# Patient Record
Sex: Female | Born: 1955 | Race: White | Hispanic: No | Marital: Married | Smoking: Never smoker
Health system: Southern US, Community
[De-identification: ages and names within clinical notes are randomized; demographics above are authoritative.]

## PROBLEM LIST (undated history)

## (undated) DIAGNOSIS — M3500 Sicca syndrome, unspecified: Secondary | ICD-10-CM

## (undated) DIAGNOSIS — M129 Arthropathy, unspecified: Secondary | ICD-10-CM

## (undated) DIAGNOSIS — D329 Benign neoplasm of meninges, unspecified: Secondary | ICD-10-CM

## (undated) HISTORY — DX: Sjogren syndrome, unspecified: M35.00

## (undated) HISTORY — DX: Benign neoplasm of meninges, unspecified: D32.9

## (undated) HISTORY — DX: Arthropathy, unspecified: M12.9

---

## 2006-01-23 HISTORY — PX: ABDOMINAL HYSTERECTOMY: SHX81

## 2010-01-23 HISTORY — PX: CRANIOTOMY: SHX93

## 2015-07-16 ENCOUNTER — Other Ambulatory Visit: Payer: Self-pay | Admitting: Internal Medicine

## 2015-07-16 DIAGNOSIS — Z1231 Encounter for screening mammogram for malignant neoplasm of breast: Secondary | ICD-10-CM

## 2015-08-06 ENCOUNTER — Ambulatory Visit (INDEPENDENT_AMBULATORY_CARE_PROVIDER_SITE_OTHER): Payer: Federal, State, Local not specified - PPO | Admitting: Neurology

## 2015-08-06 ENCOUNTER — Encounter: Payer: Self-pay | Admitting: Neurology

## 2015-08-06 ENCOUNTER — Ambulatory Visit
Admission: RE | Admit: 2015-08-06 | Discharge: 2015-08-06 | Disposition: A | Discharge status: Home / Self Care | Payer: Federal, State, Local not specified - PPO | Source: Ambulatory Visit | Attending: Internal Medicine | Admitting: Internal Medicine

## 2015-08-06 VITALS — BP 124/86 | HR 74 | Ht 65.0 in | Wt 163.5 lb

## 2015-08-06 DIAGNOSIS — R202 Paresthesia of skin: Secondary | ICD-10-CM | POA: Diagnosis not present

## 2015-08-06 DIAGNOSIS — Z1231 Encounter for screening mammogram for malignant neoplasm of breast: Secondary | ICD-10-CM

## 2015-08-06 NOTE — Progress Notes (Signed)
Reason for visit: Paresthesias  Referring physician: Dr. Kathrin Greathouse Shelia Gates is a 60 y.o. 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 The patient has had some ongoing issues with double vision, left-sided proptosis, and headaches that initiate around the left eye that occur on average once a week. The patient reports mild memory issues following surgery as well. The patient comes in to this office today for evaluation of paresthesias on all 4 extremities. The patient reports numbness involving the right hand that dates back to around 2005. The symptoms have significantly worsened over the last several months, however. The patient has numbness that is much worse when she is working with the right arm above her head, but the symptoms also bother her at night and make it difficult for her to go to sleep. The patient has noted some mild symptoms as well on the left hand and she has begun to have some numbness and tingling in the toes and feet bilaterally, left greater than right. The patient has pins and needles feelings in the feet that again are worse in the evening hours. The patient denies any significant neck or low back pain or pain radiating down the arms or the legs. She denies any weakness, she denies any significant balance issues or difficulty controlling the bowels or the bladder. The patient has also had episodes of transient confusion, she has been on Keppra for these episodes cessation of these events. She is sent to this office for an evaluation.  Past Medical History  Diagnosis Date  . Sjogren's syndrome ([place])   . Arthropathy   . Meningioma [facility])     Past Surgical History  Procedure Laterality Date  . Craniotomy  2012  . Abdominal hysterectomy  2008    Family History  Problem Relation Age of Onset  . Heart  disease Father   . Breast cancer Mother   . Hypertension Mother   . Atrial fibrillation Mother   . Goiter Maternal Grandmother   . Diabetes Maternal Grandmother     Social history:  reports that she has never smoked. She does not have any smokeless tobacco history on file. She reports that she drinks about 0.6 - 1.2 oz of alcohol per week. She reports that she does not use illicit drugs.  Medications:  Prior to Admission medications   Not on File      Allergies  Allergen Reactions  . Clindamycin/Lincomycin Rash  . Hydrocodone Nausea And Vomiting  . Penicillins Rash  . Naprosyn [Naproxen] Other (See Comments)    Reflux/aspiration    ROS:  Out of a complete 14 system review of symptoms, the patient complains only of the following symptoms, and all other reviewed systems are negative.  Weight gain Double vision Joint pain, joint swelling Allergies Memory loss, headache, numbness Insomnia, snoring, restless legs Seizures, confusional events  Blood pressure 124/86, pulse 74, height 5\' 5"  (1.651 m), weight 163 lb 8 oz (74.163 kg).  Physical Exam  General: The patient is alert and cooperative at the time of the examination.  Eyes: Pupils are equal, round, and reactive to light. Discs are flat bilaterally. Proptosis is seen on the left eye.  Neck: The neck is supple, no carotid bruits are noted.  Respiratory: The respiratory examination is clear.  Cardiovascular: The cardiovascular examination reveals a regular rate and rhythm,  no obvious murmurs or rubs are noted.  Skin: Extremities are without significant edema.  Neurologic Exam  Mental status: The patient is alert and oriented x 3 at the time of the examination. The patient has apparent normal recent and remote memory, with an apparently normal attention span and concentration ability.  Cranial nerves: Facial symmetry is present. There is good sensation of the face to pinprick and soft touch bilaterally. The  strength of the facial muscles and the muscles to head turning and shoulder shrug are normal bilaterally. Speech is well enunciated, no aphasia or dysarthria is noted. Extraocular movements are full, but on primary gaze there is medial deviation of the left eye. Visual fields are full. The tongue is midline, and the patient has symmetric elevation of the soft palate. No obvious hearing deficits are noted.  Motor: The motor testing reveals 5 over 5 strength of all 4 extremities. Good symmetric motor tone is noted throughout.  Sensory: Sensory testing is intact to pinprick, soft touch, vibration sensation, and position sense on all 4 extremities, with exception that there is a stocking pattern pinprick sensory deficit one half way up the legs bilaterally. No evidence of extinction is noted.  Coordination: Cerebellar testing reveals good finger-nose-finger and heel-to-shin bilaterally.  Gait and station: Gait is normal. Tandem gait is normal. Romberg is negative. No drift is seen.  Reflexes: Deep tendon reflexes are symmetric and normal bilaterally. Toes are downgoing bilaterally.   Assessment/Plan:  1. Probable bilateral carpal tunnel syndrome  2. Lower extremity paresthesias, possible peripheral neuropathy  3. Confusional events, presumed seizures  4. Left cavernous sinus meningioma, status post resection  5. Intermittent headache  6. Reported memory disturbance  The patient has bilateral Tinel sign at the wrists, she very well could have bilateral carpal tunnel syndrome. Her symptoms have worsened in this regard recently. She is wearing a wrist splint on the right which has helped some. She is also reporting some paresthesias in the legs, we will set her up for nerve conduction studies on all 4 extremities, EMG on the right arm. If a peripheral neuropathy is present, further blood work will be done at that time. She will follow-up for the EMG evaluation.  Jill Alexanders MD 08/06/2015  3:45 PM  [facility] [address] [place], [place] [address]  Phone [phone] Fax [phone]

## 2015-09-08 ENCOUNTER — Ambulatory Visit (INDEPENDENT_AMBULATORY_CARE_PROVIDER_SITE_OTHER): Payer: Self-pay | Admitting: Neurology

## 2015-09-08 ENCOUNTER — Ambulatory Visit (INDEPENDENT_AMBULATORY_CARE_PROVIDER_SITE_OTHER): Payer: Federal, State, Local not specified - PPO | Admitting: Neurology

## 2015-09-08 ENCOUNTER — Other Ambulatory Visit: Payer: Self-pay | Admitting: Neurology

## 2015-09-08 ENCOUNTER — Encounter: Payer: Self-pay | Admitting: Neurology

## 2015-09-08 DIAGNOSIS — R202 Paresthesia of skin: Secondary | ICD-10-CM

## 2015-09-08 NOTE — Procedures (Signed)
     HISTORY:  Shelia Gates is a 60 year old patient with a history of paresthesias that involves all 4 extremities. The patient has significant discomfort involving the right arm and hand in particular. She denies any significant neck discomfort or pain radiating down the arms or legs. She is being evaluated for a possible peripheral neuropathy. The patient does have a history of Sjogren syndrome.  NERVE CONDUCTION STUDIES:  Nerve conduction studies were performed on both upper extremities. The distal motor latencies and motor amplitudes for the median and ulnar nerves were within normal limits. The F wave latencies and nerve conduction velocities for these nerves were also normal. The sensory latencies for the median and ulnar nerves were normal.  Nerve conduction studies were performed on both lower extremities. The distal motor latencies and motor amplitudes for the peroneal and posterior tibial nerves were within normal limits. The nerve conduction velocities for these nerves were also normal. The H reflex latencies were normal. The sensory latencies for the peroneal nerves were within normal limits.   EMG STUDIES:  EMG study was performed on the right upper extremity:  The first dorsal interosseous muscle reveals 2 to 4 K units with full recruitment. No fibrillations or positive waves were noted. The abductor pollicis brevis muscle reveals 2 to 4 K units with full recruitment. No fibrillations or positive waves were noted. The extensor indicis proprius muscle reveals 1 to 3 K units with full recruitment. No fibrillations or positive waves were noted. The pronator teres muscle reveals 2 to 3 K units with full recruitment. No fibrillations or positive waves were noted. The biceps muscle reveals 1 to 2 K units with full recruitment. No fibrillations or positive waves were noted. The triceps muscle reveals 2 to 4 K units with full recruitment. No fibrillations or positive waves were  noted. The anterior deltoid muscle reveals 2 to 3 K units with full recruitment. No fibrillations or positive waves were noted. The cervical paraspinal muscles were tested at 2 levels. No abnormalities of insertional activity were seen at either level tested. There was good relaxation.   IMPRESSION:  Nerve conduction studies done on all 4 extremities were within normal limits. No evidence of a peripheral neuropathy is seen. A small fiber neuropathy may be missed by a standard nerve conduction studies, however. Clinical correlation is required. EMG evaluation of the right upper extremity is unremarkable without evidence of an overlying cervical radiculopathy.  Jill Alexanders MD 09/08/2015 3:51 PM  [facility] [address] [place], [place] [address]  Phone [phone] Fax [phone]

## 2015-09-08 NOTE — Progress Notes (Signed)
The patient comes in today for EMG and nerve conduction study evaluation. These studies were completely normal, small fiber neuropathy could not be excluded.  Patient will be sent for blood work to exclude metabolic etiologies of her paresthesias on all 4 extremities. We may consider MRI of the cervical spine if the blood work is unremarkable.

## 2015-09-08 NOTE — Progress Notes (Signed)
Please refer to EMG and nerve conduction study procedure note. 

## 2015-09-10 ENCOUNTER — Telehealth: Payer: Self-pay

## 2015-09-10 LAB — MULTIPLE MYELOMA PANEL, SERUM
ALBUMIN/GLOB SERPL: 1.4 (ref 0.7–1.7)
ALPHA 1: 0.2 g/dL (ref 0.0–0.4)
ALPHA2 GLOB SERPL ELPH-MCNC: 0.7 g/dL (ref 0.4–1.0)
Albumin SerPl Elph-Mcnc: 4 g/dL (ref 2.9–4.4)
B-Globulin SerPl Elph-Mcnc: 1 g/dL (ref 0.7–1.3)
Gamma Glob SerPl Elph-Mcnc: 1 g/dL (ref 0.4–1.8)
Globulin, Total: 2.9 g/dL (ref 2.2–3.9)
IGM (IMMUNOGLOBULIN M), SRM: 65 mg/dL (ref 26–217)
IgA/Immunoglobulin A, Serum: 199 mg/dL (ref 87–352)
IgG (Immunoglobin G), Serum: 951 mg/dL (ref 700–1600)
TOTAL PROTEIN: 6.9 g/dL (ref 6.0–8.5)

## 2015-09-10 LAB — SEDIMENTATION RATE: Sed Rate: 2 mm/hr (ref 0–40)

## 2015-09-10 LAB — HIV ANTIBODY (ROUTINE TESTING W REFLEX): HIV Screen 4th Generation wRfx: NONREACTIVE

## 2015-09-10 LAB — VITAMIN B12: VITAMIN B 12: 369 pg/mL (ref 211–946)

## 2015-09-10 LAB — COPPER, SERUM: COPPER: 122 ug/dL (ref 72–166)

## 2015-09-10 LAB — RPR: RPR Ser Ql: NONREACTIVE

## 2015-09-10 LAB — B. BURGDORFI ANTIBODIES: Lyme IgG/IgM Ab: <0.91 {ISR} (ref 0.00–0.90)

## 2015-09-10 NOTE — Telephone Encounter (Signed)
-----   Message from Kathrynn Ducking, MD sent at 09/10/2015  7:23 AM EDT -----  The blood work results are unremarkable. Please call the patient.  ----- Message ----- From: Lavone Neri Lab Results In Sent: 09/09/2015   7:43 AM To: Kathrynn Ducking, MD

## 2015-09-10 NOTE — Telephone Encounter (Signed)
Called pt w/ unremarkable lab results. Verbalized understanding and appreciation for call. Results faxed to PCP (Dr. Achille Rich F# [phone]) per pt request.

## 2016-05-08 ENCOUNTER — Emergency Department
Admission: EM | Admit: 2016-05-08 | Discharge: 2016-05-08 | Disposition: A | Discharge status: Home / Self Care | Payer: Federal, State, Local not specified - PPO | Source: Home / Self Care | Attending: Family Medicine | Admitting: Family Medicine

## 2016-05-08 DIAGNOSIS — J069 Acute upper respiratory infection, unspecified: Secondary | ICD-10-CM

## 2016-05-08 DIAGNOSIS — B9789 Other viral agents as the cause of diseases classified elsewhere: Secondary | ICD-10-CM | POA: Diagnosis not present

## 2016-05-08 MED ORDER — BENZONATATE 200 MG PO CAPS: ORAL_CAPSULE | ORAL | 0 refills | Status: DC

## 2016-05-08 MED ORDER — DOXYCYCLINE HYCLATE 100 MG PO CAPS: 100.0000 mg | ORAL_CAPSULE | Freq: Two times a day (BID) | ORAL | 0 refills | Status: DC

## 2016-05-08 NOTE — ED Triage Notes (Signed)
Started Thursday, pt thought at first it was just allergies.  She became worse to the point that when she would lay down to sleep at night she heard "squeaking and crackles" in her lungs.  Has a yellow productive cough.  Denies fever

## 2016-05-08 NOTE — Discharge Instructions (Signed)
Take plain guaifenesin (1200mg extended release tabs such as Mucinex) twice daily, with plenty of water, for cough and congestion.  Get adequate rest.   °May use Afrin nasal spray (or generic oxymetazoline) each morning for about 5 days and then discontinue.  Also recommend using saline nasal spray several times daily and saline nasal irrigation (AYR is a common brand).  Use Flonase nasal spray each morning after using Afrin nasal spray and saline nasal irrigation. °Try warm salt water gargles for sore throat.  °Stop all antihistamines for now, and other non-prescription cough/cold preparations. °Begin Doxycycline if not improving about one week or if persistent fever develops   °

## 2016-05-08 NOTE — ED Provider Notes (Addendum)
[facility]    CSN: [id number] Arrival date & time: 05/08/16  1200     History   Chief Complaint Chief Complaint  Patient presents with  . Cough  . Chest Pain    HPI Shelia Gates is a 61 y.o. female.   Patient states that she has perennial allergies, and they began to flare up several weeks ago with increased clear nasal drainage.  She developed a partly productive cough 5 days ago that has become worse at night with ?wheezing during the night.  Yesterday she developed a scratchy throat.  She is now having pressure in her left ear, with difficulty equalizing pressure in her ears.  No fevers, chills, and sweats.    The history is provided by the patient.    Past Medical History:  Diagnosis Date  . Arthropathy   . Meningioma ([place])   . Sjogren's syndrome [facility])     Patient Active Problem List   Diagnosis Date Noted  . Paresthesia 08/06/2015    Past Surgical History:  Procedure Laterality Date  . ABDOMINAL HYSTERECTOMY  2008  . CRANIOTOMY  2012    OB History    No data available       Home Medications    Prior to Admission medications   Medication Sig Start Date End Date Taking? Authorizing Provider  amLODipine (NORVASC) 5 MG tablet Take 5 mg by mouth daily. 07/10/15   Historical Provider, MD  aspirin 81 MG chewable tablet Chew by mouth.    Historical Provider, MD  benzonatate (TESSALON) 200 MG capsule Take one cap by mouth at bedtime as needed for cough.  May repeat in 4 to 6 hours 05/08/16   Kandra Nicolas, MD  carvedilol (COREG) 12.5 MG tablet Take by mouth.    Historical Provider, MD  doxycycline (VIBRAMYCIN) 100 MG capsule Take 1 capsule (100 mg total) by mouth 2 (two) times daily. Take with food (Rx void after 05/16/16) 05/08/16   Kandra Nicolas, MD  estradiol (ESTRACE) 1 MG tablet Take by mouth.    Historical Provider, MD  fluticasone (FLONASE) 50 MCG/ACT nasal spray Place 2 sprays into both nostrils daily. Reported on 08/06/2015 05/01/15    Historical Provider, MD  hydroxychloroquine (PLAQUENIL) 200 MG tablet Take by mouth.    Historical Provider, MD  leflunomide (ARAVA) 10 MG tablet Take by mouth.    Historical Provider, MD  levETIRAcetam (KEPPRA) 500 MG tablet Take by mouth.    Historical Provider, MD  meloxicam (MOBIC) 15 MG tablet Take by mouth.    Historical Provider, MD    Family History Family History  Problem Relation Age of Onset  . Breast cancer Mother   . Hypertension Mother   . Atrial fibrillation Mother   . Heart disease Father   . Goiter Maternal Grandmother   . Diabetes Maternal Grandmother     Social History Social History  Substance Use Topics  . Smoking status: Never Smoker  . Smokeless tobacco: Not on file  . Alcohol use 0.6 - 1.2 oz/week    1 - 2 Standard drinks or equivalent per week     Allergies   Clindamycin/lincomycin; Hydrocodone; Penicillins; and Naprosyn [naproxen]   Review of Systems Review of Systems + sore throat + cough No pleuritic pain ? Wheezing at night + nasal congestion + post-nasal drainage No sinus pain/pressure No itchy/red eyes ? left earache No hemoptysis No SOB No fever/chills No nausea No vomiting No abdominal pain No diarrhea No urinary symptoms No  skin rash + fatigue + myalgias + headache Used OTC meds without relief   Physical Exam Triage Vital Signs ED Triage Vitals  Enc Vitals Group     BP 05/08/16 1334 (!) 165/101     Pulse Rate 05/08/16 1334 70     Resp --      Temp 05/08/16 1334 98.3 F (36.8 C)     Temp Source 05/08/16 1334 Oral     SpO2 05/08/16 1334 98 %     Weight 05/08/16 1335 168 lb (76.2 kg)     Height 05/08/16 1335 5\' 5"  (1.651 m)     Head Circumference --      Peak Flow --      Pain Score 05/08/16 1336 0     Pain Loc --      Pain Edu? --      Excl. in [place]? --    No data found.   Updated Vital Signs BP (!) 165/101 (BP Location: Left Arm)   Pulse 70   Temp 98.3 F (36.8 C) (Oral)   Ht 5\' 5"  (1.651 m)   Wt 168  lb (76.2 kg)   SpO2 98%   BMI 27.96 kg/m   Visual Acuity Right Eye Distance:   Left Eye Distance:   Bilateral Distance:    Right Eye Near:   Left Eye Near:    Bilateral Near:     Physical Exam Nursing notes and Vital Signs reviewed. Appearance:  Patient appears stated age, and in no acute distress Eyes:  Right pupil round, and reactive to light and accomodation.  Extraocular movement is intact.  Conjunctivae are not inflamed.  Patient wears a left eye patch.  Ears:  Canals normal.  Tympanic membranes normal.  Nose:  Mildly congested turbinates.  No sinus tenderness.    Pharynx:  Normal Neck:  Supple.  Tender enlarged posterior/lateral nodes are palpated bilaterally  Lungs:  Clear to auscultation.  Breath sounds are equal.  Moving air well. Heart:  Regular rate and rhythm without murmurs, rubs, or gallops.  Abdomen:  Nontender without masses or hepatosplenomegaly.  Bowel sounds are present.  No CVA or flank tenderness.  Extremities:  No edema.  Skin:  No rash present.    UC Treatments / Results  Labs (all labs ordered are listed, but only abnormal results are displayed) Labs Reviewed - No data to display  EKG  EKG Interpretation None       Radiology No results found.  Procedures Procedures (including critical care time)  Medications Ordered in UC Medications - No data to display   Initial Impression / Assessment and Plan / UC Course  I have reviewed the triage vital signs and the nursing notes.  Pertinent labs & imaging results that were available during my care of the patient were reviewed by me and considered in my medical decision making (see chart for details).    There is no evidence of bacterial infection today.   Treat symptomatically for now  Prescription written for Benzonatate (Tessalon) to take at bedtime for night-time cough.  Take plain guaifenesin (1200mg  extended release tabs such as Mucinex) twice daily, with plenty of water, for cough and  congestion.  Get adequate rest.   May use Afrin nasal spray (or generic oxymetazoline) each morning for about 5 days and then discontinue.  Also recommend using saline nasal spray several times daily and saline nasal irrigation (AYR is a common brand).  Use Flonase nasal spray each morning after using Afrin nasal  spray and saline nasal irrigation. Try warm salt water gargles for sore throat.  Stop all antihistamines for now, and other non-prescription cough/cold preparations. Begin Doxycycline if not improving about one week or if persistent fever develops (Given a prescription to hold, with an expiration date)     Final Clinical Impressions(s) / UC Diagnoses   Final diagnoses:  Viral URI with cough    New Prescriptions New Prescriptions   BENZONATATE (TESSALON) 200 MG CAPSULE    Take one cap by mouth at bedtime as needed for cough.  May repeat in 4 to 6 hours   DOXYCYCLINE (VIBRAMYCIN) 100 MG CAPSULE    Take 1 capsule (100 mg total) by mouth 2 (two) times daily. Take with food (Rx void after 05/16/16)     Kandra Nicolas, MD 05/11/16 1552    Kandra Nicolas, MD 05/11/16 [phone]

## 2016-05-11 ENCOUNTER — Emergency Department
Admission: EM | Admit: 2016-05-11 | Discharge: 2016-05-11 | Disposition: A | Discharge status: Home / Self Care | Payer: Federal, State, Local not specified - PPO | Source: Home / Self Care | Attending: Family Medicine | Admitting: Family Medicine

## 2016-05-11 ENCOUNTER — Emergency Department (INDEPENDENT_AMBULATORY_CARE_PROVIDER_SITE_OTHER): Payer: Federal, State, Local not specified - PPO

## 2016-05-11 ENCOUNTER — Encounter: Payer: Self-pay | Admitting: *Deleted

## 2016-05-11 DIAGNOSIS — R0602 Shortness of breath: Secondary | ICD-10-CM

## 2016-05-11 DIAGNOSIS — B9789 Other viral agents as the cause of diseases classified elsewhere: Secondary | ICD-10-CM | POA: Diagnosis not present

## 2016-05-11 DIAGNOSIS — J069 Acute upper respiratory infection, unspecified: Secondary | ICD-10-CM | POA: Diagnosis not present

## 2016-05-11 DIAGNOSIS — B349 Viral infection, unspecified: Secondary | ICD-10-CM

## 2016-05-11 DIAGNOSIS — J9801 Acute bronchospasm: Secondary | ICD-10-CM

## 2016-05-11 DIAGNOSIS — R05 Cough: Secondary | ICD-10-CM

## 2016-05-11 MED ORDER — IPRATROPIUM-ALBUTEROL 0.5-2.5 (3) MG/3ML IN SOLN
3.0000 mL | Freq: Once | RESPIRATORY_TRACT | Status: AC
  Administered 2016-05-11: 3 mL via RESPIRATORY_TRACT

## 2016-05-11 MED ORDER — METHYLPREDNISOLONE SODIUM SUCC 125 MG IJ SOLR
125.0000 mg | Freq: Once | INTRAMUSCULAR | Status: AC
  Administered 2016-05-11: 125 mg via INTRAMUSCULAR

## 2016-05-11 MED ORDER — PREDNISONE 20 MG PO TABS: ORAL_TABLET | ORAL | 0 refills | Status: DC

## 2016-05-11 MED ORDER — ALBUTEROL SULFATE HFA 108 (90 BASE) MCG/ACT IN AERS: 2.0000 | INHALATION_SPRAY | RESPIRATORY_TRACT | 0 refills | Status: AC | PRN

## 2016-05-11 NOTE — Discharge Instructions (Signed)
Begin prednisone Friday 05/12/16.   Continue Mucinex.  Continue Tessalon (benzonatate) for cough at bedtime. Use albuterol inhaler as needed.  Begin doxycycline if not improving 2 to 3 days or if fever develops.

## 2016-05-11 NOTE — ED Triage Notes (Signed)
Patient was seen for URI 05/08/16/ Since she has not improved and feels wheezing and SOB is worse. Afebrile.

## 2016-05-11 NOTE — ED Provider Notes (Signed)
[facility]    CSN: [id number] Arrival date & time: 05/11/16  1225     History   Chief Complaint Chief Complaint  Patient presents with  . Cough  . Shortness of Breath    HPI Shelia Gates is a 61 y.o. female.   Patient was evaluated for URI three days ago.  Since then she has developed persistent wheezing and shortness of breath, worse at night.  No pleuritic pain.  No fevers, chills, and sweats.  Her cough persists and is non-productive.   The history is provided by the patient.    Past Medical History:  Diagnosis Date  . Arthropathy   . Meningioma ([place])   . Sjogren's syndrome [facility])     Patient Active Problem List   Diagnosis Date Noted  . Paresthesia 08/06/2015    Past Surgical History:  Procedure Laterality Date  . ABDOMINAL HYSTERECTOMY  2008  . CRANIOTOMY  2012    OB History    No data available       Home Medications    Prior to Admission medications   Medication Sig Start Date End Date Taking? Authorizing Provider  albuterol (PROVENTIL HFA;VENTOLIN HFA) 108 (90 Base) MCG/ACT inhaler Inhale 2 puffs into the lungs every 4 (four) hours as needed for wheezing or shortness of breath. 05/11/16   Kandra Nicolas, MD  amLODipine (NORVASC) 5 MG tablet Take 5 mg by mouth daily. 07/10/15   Historical Provider, MD  aspirin 81 MG chewable tablet Chew by mouth.    Historical Provider, MD  benzonatate (TESSALON) 200 MG capsule Take one cap by mouth at bedtime as needed for cough.  May repeat in 4 to 6 hours 05/08/16   Kandra Nicolas, MD  carvedilol (COREG) 12.5 MG tablet Take by mouth.    Historical Provider, MD  doxycycline (VIBRAMYCIN) 100 MG capsule Take 1 capsule (100 mg total) by mouth 2 (two) times daily. Take with food (Rx void after 05/16/16) 05/08/16   Kandra Nicolas, MD  estradiol (ESTRACE) 1 MG tablet Take by mouth.    Historical Provider, MD  fluticasone (FLONASE) 50 MCG/ACT nasal spray Place 2 sprays into both nostrils daily. Reported on  08/06/2015 05/01/15   Historical Provider, MD  hydroxychloroquine (PLAQUENIL) 200 MG tablet Take by mouth.    Historical Provider, MD  leflunomide (ARAVA) 10 MG tablet Take by mouth.    Historical Provider, MD  levETIRAcetam (KEPPRA) 500 MG tablet Take by mouth.    Historical Provider, MD  meloxicam (MOBIC) 15 MG tablet Take by mouth.    Historical Provider, MD  predniSONE (DELTASONE) 20 MG tablet Take one tab by mouth twice daily for 5 days, then one daily for 3 days. Take with food. 05/11/16   Kandra Nicolas, MD    Family History Family History  Problem Relation Age of Onset  . Breast cancer Mother   . Hypertension Mother   . Atrial fibrillation Mother   . Heart disease Father   . Goiter Maternal Grandmother   . Diabetes Maternal Grandmother     Social History Social History  Substance Use Topics  . Smoking status: Never Smoker  . Smokeless tobacco: Never Used  . Alcohol use 0.6 - 1.2 oz/week    1 - 2 Standard drinks or equivalent per week     Allergies   Clindamycin/lincomycin; Hydrocodone; Penicillins; and Naprosyn [naproxen]   Review of Systems Review of Systems  + sore throat + cough No pleuritic pain + wheezing +  nasal congestion + post-nasal drainage No sinus pain/pressure No itchy/red eyes No earache No hemoptysis + SOB No fever/chills No nausea No vomiting No abdominal pain No diarrhea No urinary symptoms No skin rash + fatigue No myalgias No headache     Physical Exam Triage Vital Signs ED Triage Vitals  Enc Vitals Group     BP 05/11/16 1242 (!) 143/86     Pulse Rate 05/11/16 1242 94     Resp 05/11/16 1242 18     Temp 05/11/16 1242 98.7 F (37.1 C)     Temp Source 05/11/16 1242 Oral     SpO2 05/11/16 1337 97 %     Weight --      Height --      Head Circumference --      Peak Flow --      Pain Score 05/11/16 1243 3     Pain Loc --      Pain Edu? --      Excl. in [place]? --    No data found.   Updated Vital Signs BP (!) 143/86 (BP  Location: Left Arm)   Pulse 94   Temp 98.7 F (37.1 C) (Oral)   Resp 18   SpO2 97% Comment: POST DUONEB  Visual Acuity Right Eye Distance:   Left Eye Distance:   Bilateral Distance:    Right Eye Near:   Left Eye Near:    Bilateral Near:     Physical Exam Nursing notes and Vital Signs reviewed. Appearance:  Patient appears stated age, and in no acute distress Eyes:   Right pupil round, and reactive to light and accomodation.  Extraocular movement is intact.  Conjunctivae are not inflamed.  She wears a left eye patch. Ears:  Canals normal.  Tympanic membranes normal.  Nose:  Mildly congested turbinates.  No sinus tenderness.   Pharynx:  Normal Neck:  Supple.  Tender enlarged posterior/lateral nodes are palpated bilaterally  Lungs:  Diffuse bilateral expiratory wheezes.  Breath sounds are equal.  Moving air well.  Post DuoNeb treatment, wheezes almost completely cleared. Heart:  Regular rate and rhythm without murmurs, rubs, or gallops.  Abdomen:  Nontender without masses or hepatosplenomegaly.  Bowel sounds are present.  No CVA or flank tenderness.  Extremities:  No edema.  Skin:  No rash present.    UC Treatments / Results  Labs (all labs ordered are listed, but only abnormal results are displayed) Labs Reviewed - No data to display  EKG  EKG Interpretation None       Radiology Dg Chest 2 View  Result Date: 05/11/2016 CLINICAL DATA:  61 year old female with cough congestion and shortness of breath for 1 week. Initial encounter. EXAM: CHEST  2 VIEW COMPARISON:  None. FINDINGS: Mediastinal contours are within normal limits. Lung volumes are within normal limits. No pneumothorax, pulmonary edema, pleural effusion or confluent pulmonary opacity. No acute osseous abnormality identified. Negative visible bowel gas pattern. IMPRESSION: Negative.  No acute cardiopulmonary abnormality. Electronically Signed   By: Genevie Cornie M.D.   On: 05/11/2016 13:01    Procedures Procedures  (including critical care time)  Medications Ordered in UC Medications  methylPREDNISolone sodium succinate (SOLU-MEDROL) 125 mg/2 mL injection 125 mg (not administered)  ipratropium-albuterol (DUONEB) 0.5-2.5 (3) MG/3ML nebulizer solution 3 mL (3 mLs Nebulization Given 05/11/16 1331)     Initial Impression / Assessment and Plan / UC Course  I have reviewed the triage vital signs and the nursing notes.  Pertinent labs & imaging  results that were available during my care of the patient were reviewed by me and considered in my medical decision making (see chart for details).    Normal chest X-ray reassuring. Administered DuoNeb by hand held nebulizer (note improved SpO2 97% afterwards) Administered Solumedrol 125mg  IM  Begin prednisone burst/taper Friday 05/12/16.   Continue Mucinex.  Continue Tessalon (benzonatate) for cough at bedtime. Use albuterol inhaler as needed.  Begin doxycycline if not improving 2 to 3 days or if fever develops.    Final Clinical Impressions(s) / UC Diagnoses   Final diagnoses:  Viral URI with cough  Acute bronchospasm due to viral infection    New Prescriptions New Prescriptions   ALBUTEROL (PROVENTIL HFA;VENTOLIN HFA) 108 (90 BASE) MCG/ACT INHALER    Inhale 2 puffs into the lungs every 4 (four) hours as needed for wheezing or shortness of breath.   PREDNISONE (DELTASONE) 20 MG TABLET    Take one tab by mouth twice daily for 5 days, then one daily for 3 days. Take with food.     Kandra Nicolas, MD 05/11/16 [phone]

## 2016-09-04 ENCOUNTER — Ambulatory Visit (INDEPENDENT_AMBULATORY_CARE_PROVIDER_SITE_OTHER): Payer: Federal, State, Local not specified - PPO | Admitting: Obstetrics & Gynecology

## 2016-09-04 ENCOUNTER — Encounter: Payer: Self-pay | Admitting: Obstetrics & Gynecology

## 2016-09-04 VITALS — BP 138/99 | HR 67 | Ht 65.0 in | Wt 167.2 lb

## 2016-09-04 DIAGNOSIS — Z7689 Persons encountering health services in other specified circumstances: Secondary | ICD-10-CM

## 2016-09-04 NOTE — Progress Notes (Signed)
Subjective:   S. 62 yo MW P1 (7 yo daughter) here to establish care. She recently moved from [place]. She had an annual exam in [place] in June of this year. She no longer needs paps as she had a hysterectomy (with removal of ovaries and tubes)  in 2008 for prolapse. She is on estratest and will need a refill in the future, gets q 3 month prescriptions (1.25/2.5).

## 2016-09-04 NOTE — Progress Notes (Signed)
Patient here for GYN visit today.

## 2016-09-14 ENCOUNTER — Other Ambulatory Visit: Payer: Self-pay | Admitting: Obstetrics & Gynecology

## 2016-10-10 ENCOUNTER — Other Ambulatory Visit (HOSPITAL_COMMUNITY): Payer: Self-pay | Admitting: Obstetrics & Gynecology

## 2016-10-10 DIAGNOSIS — Z1231 Encounter for screening mammogram for malignant neoplasm of breast: Secondary | ICD-10-CM

## 2016-10-13 ENCOUNTER — Ambulatory Visit: Payer: Federal, State, Local not specified - PPO

## 2016-10-21 ENCOUNTER — Other Ambulatory Visit: Payer: Self-pay | Admitting: Obstetrics & Gynecology

## 2016-10-23 ENCOUNTER — Telehealth: Payer: Self-pay | Admitting: *Deleted

## 2016-10-23 DIAGNOSIS — N951 Menopausal and female climacteric states: Secondary | ICD-10-CM

## 2016-10-23 MED ORDER — ESTRADIOL 1 MG PO TABS: 1.0000 mg | ORAL_TABLET | Freq: Every day | ORAL | 3 refills | Status: DC

## 2016-10-23 NOTE — Telephone Encounter (Signed)
Pt request RF on Estrace vaginal tab for 90 supply.  Per Dr Hulan Fray RF [facility].

## 2016-11-20 ENCOUNTER — Other Ambulatory Visit: Payer: Self-pay | Admitting: Obstetrics & Gynecology

## 2016-12-19 ENCOUNTER — Ambulatory Visit: Payer: Federal, State, Local not specified - PPO

## 2016-12-21 ENCOUNTER — Ambulatory Visit
Admission: RE | Admit: 2016-12-21 | Discharge: 2016-12-21 | Disposition: A | Discharge status: Home / Self Care | Payer: Federal, State, Local not specified - PPO | Source: Ambulatory Visit | Attending: Obstetrics & Gynecology | Admitting: Obstetrics & Gynecology

## 2016-12-21 DIAGNOSIS — Z1231 Encounter for screening mammogram for malignant neoplasm of breast: Secondary | ICD-10-CM

## 2016-12-25 ENCOUNTER — Other Ambulatory Visit (HOSPITAL_COMMUNITY): Payer: Self-pay | Admitting: Obstetrics & Gynecology

## 2016-12-25 DIAGNOSIS — R928 Other abnormal and inconclusive findings on diagnostic imaging of breast: Secondary | ICD-10-CM

## 2016-12-28 ENCOUNTER — Ambulatory Visit
Admission: RE | Admit: 2016-12-28 | Discharge: 2016-12-28 | Disposition: A | Discharge status: Home / Self Care | Payer: Federal, State, Local not specified - PPO | Source: Ambulatory Visit | Attending: Obstetrics & Gynecology | Admitting: Obstetrics & Gynecology

## 2016-12-28 DIAGNOSIS — R928 Other abnormal and inconclusive findings on diagnostic imaging of breast: Secondary | ICD-10-CM

## 2017-05-11 ENCOUNTER — Other Ambulatory Visit: Payer: Self-pay | Admitting: Obstetrics & Gynecology

## 2017-07-12 ENCOUNTER — Other Ambulatory Visit: Payer: Self-pay

## 2017-07-12 ENCOUNTER — Encounter: Payer: Self-pay | Admitting: Emergency Medicine

## 2017-07-12 ENCOUNTER — Emergency Department
Admission: EM | Admit: 2017-07-12 | Discharge: 2017-07-12 | Disposition: A | Discharge status: Home / Self Care | Payer: Federal, State, Local not specified - PPO | Source: Home / Self Care | Attending: Family Medicine | Admitting: Family Medicine

## 2017-07-12 DIAGNOSIS — L03011 Cellulitis of right finger: Secondary | ICD-10-CM

## 2017-07-12 MED ORDER — MUPIROCIN 2 % EX OINT: TOPICAL_OINTMENT | CUTANEOUS | 0 refills | Status: AC

## 2017-07-12 NOTE — ED Triage Notes (Signed)
Right index finger red at base of nail bed x 2 weeks. She was in [place] when she noticed it, did not have it checked, denies injury.

## 2017-07-12 NOTE — Discharge Instructions (Signed)
°  It is recommended you soak your finger in warm water with Epson salt for 10-15 minutes 2-3 times daily. Pat dry the area, then apply the prescribed antibiotic ointment. You may want to wear a bandage to help the medication stay on your finger longer.  Please follow up with family medicine or return to urgent care in 4-5 days if not improving, sooner if worsening.

## 2017-07-12 NOTE — ED Provider Notes (Signed)
[facility]    CSN: [id number] Arrival date & time: 07/12/17  0813     History   Chief Complaint Chief Complaint  Patient presents with  . Hand Pain    HPI Conswella Bruney is a 62 y.o. female.   HPI  Karesa Maultsby is a 62 y.o. female presenting to UC with c/o Right index finger pain that has been intermittent with associated redness of skin around her nailbed for about 2 weeks.  She was in [place] when symptoms started.  She showed it to her rheumatologist last week, who wasn't too concerned at the time but advised her to monitor it.  This morning the pain was worse.  She does not believe it needs to be lanced yet but wanted it to be looked at just in case.    Past Medical History:  Diagnosis Date  . Arthropathy   . Meningioma ([place])   . Sjogren's syndrome [facility])     Patient Active Problem List   Diagnosis Date Noted  . Paresthesia 08/06/2015    Past Surgical History:  Procedure Laterality Date  . ABDOMINAL HYSTERECTOMY  2008  . CRANIOTOMY  2012    OB History    Gravida  1   Para      Term      Preterm      AB      Living  1     SAB      TAB      Ectopic      Multiple      Live Births               Home Medications    Prior to Admission medications   Medication Sig Start Date End Date Taking? Authorizing Provider  candesartan-hydrochlorothiazide (ATACAND HCT) 16-12.5 MG tablet Take 1 tablet by mouth daily.   Yes [provider]  meloxicam (MOBIC) 15 MG tablet Take 15 mg by mouth daily.   Yes [provider]  albuterol (PROVENTIL HFA;VENTOLIN HFA) 108 (90 Base) MCG/ACT inhaler Inhale 2 puffs into the lungs every 4 (four) hours as needed for wheezing or shortness of breath. 05/11/16   Kandra Nicolas, MD  amLODipine (NORVASC) 5 MG tablet Take 5 mg by mouth daily. 07/10/15   [provider]  aspirin 81 MG chewable tablet Chew by mouth.    [provider]  carvedilol (COREG) 12.5 MG tablet Take by mouth.     [provider]  estradiol (ESTRACE) 1 MG tablet Take 1 tablet (1 mg total) by mouth daily. 10/23/16   Dove, Wilhemina Cash, MD  estrogens-methylTEST (ESTRATEST) 1.25-2.5 MG tablet TAKE 1 TABLET EVERY DAY 10/31/16   Dove, Myra C, MD  fluticasone (FLONASE) 50 MCG/ACT nasal spray Place 2 sprays into both nostrils daily. Reported on 08/06/2015 05/01/15   [provider]  hydroxychloroquine (PLAQUENIL) 200 MG tablet Take by mouth.    [provider]  levETIRAcetam (KEPPRA) 500 MG tablet Take by mouth.    [provider]  mupirocin ointment (BACTROBAN) 2 % Apply to finger 3 times daily for 5 days 07/12/17   Noe Gens, PA-C    Family History Family History  Problem Relation Age of Onset  . Breast cancer Mother 64  . Hypertension Mother   . Atrial fibrillation Mother   . Heart disease Father   . Goiter Maternal Grandmother   . Diabetes Maternal Grandmother     Social History Social History   Tobacco Use  .  Smoking status: Never Smoker  . Smokeless tobacco: Never Used  Substance Use Topics  . Alcohol use: Yes    Alcohol/week: 0.6 - 1.2 oz    Types: 1 - 2 Standard drinks or equivalent per week  . Drug use: No     Allergies   Clindamycin/lincomycin; Hydrocodone; Penicillins; and Naprosyn [naproxen]   Review of Systems Review of Systems  Musculoskeletal: Negative for arthralgias, joint swelling and myalgias.  Skin: Positive for color change. Negative for wound.     Physical Exam Triage Vital Signs ED Triage Vitals  Enc Vitals Group     BP 07/12/17 0827 125/89     Pulse Rate 07/12/17 0827 61     Resp --      Temp 07/12/17 0827 98.4 F (36.9 C)     Temp Source 07/12/17 0827 Oral     SpO2 07/12/17 0827 99 %     Weight 07/12/17 0828 178 lb (80.7 kg)     Height 07/12/17 0828 5\' 5"  (1.651 m)     Head Circumference --      Peak Flow --      Pain Score 07/12/17 0828 3     Pain Loc --      Pain Edu? --      Excl. in [place]? --    No data  found.  Updated Vital Signs BP 125/89 (BP Location: Right Arm)   Pulse 61   Temp 98.4 F (36.9 C) (Oral)   Ht 5\' 5"  (1.651 m)   Wt 178 lb (80.7 kg)   SpO2 99%   BMI 29.62 kg/m   Visual Acuity Right Eye Distance:   Left Eye Distance:   Bilateral Distance:    Right Eye Near:   Left Eye Near:    Bilateral Near:     Physical Exam  Constitutional: She is oriented to person, place, and time. She appears well-developed and well-nourished. No distress.  HENT:  Head: Normocephalic and atraumatic.  Eyes: EOM are normal.  Neck: Normal range of motion.  Cardiovascular: Normal rate.  Pulmonary/Chest: Effort normal.  Musculoskeletal: Normal range of motion. She exhibits tenderness.  Right index finger: full ROM, mild tenderness to distal aspect.   Neurological: She is alert and oriented to person, place, and time.  Skin: Skin is warm and dry. Capillary refill takes less than 2 seconds. She is not diaphoretic. There is erythema.  Right index finger: minimal edema with erythema along radial aspect of nailbed, mildly tender. No fluctuance, bleeding or drainage.   Psychiatric: She has a normal mood and affect. Her behavior is normal.  Nursing note and vitals reviewed.    UC Treatments / Results  Labs (all labs ordered are listed, but only abnormal results are displayed) Labs Reviewed - No data to display  EKG None  Radiology No results found.  Procedures Procedures (including critical care time)  Medications Ordered in UC Medications - No data to display  Initial Impression / Assessment and Plan / UC Course  I have reviewed the triage vital signs and the nursing notes.  Pertinent labs & imaging results that were available during my care of the patient were reviewed by me and considered in my medical decision making (see chart for details).    Exam c/w early paronychia. No indication for I&D at this time.  Home care instructions with info packet provided.   Final  Clinical Impressions(s) / UC Diagnoses   Final diagnoses:  Paronychia of right index finger  Discharge Instructions      It is recommended you soak your finger in warm water with Epson salt for 10-15 minutes 2-3 times daily. Pat dry the area, then apply the prescribed antibiotic ointment. You may want to wear a bandage to help the medication stay on your finger longer.  Please follow up with family medicine or return to urgent care in 4-5 days if not improving, sooner if worsening.     ED Prescriptions    Medication Sig Dispense Auth. Provider   mupirocin ointment (BACTROBAN) 2 % Apply to finger 3 times daily for 5 days 22 g Noe Gens, PA-C     Controlled Substance Prescriptions [place] Controlled Substance Registry consulted? Not Applicable   Tyrell Antonio 07/12/17 7915

## 2017-08-16 ENCOUNTER — Encounter: Payer: Self-pay | Admitting: Obstetrics & Gynecology

## 2017-08-16 ENCOUNTER — Ambulatory Visit: Payer: Federal, State, Local not specified - PPO | Admitting: Obstetrics & Gynecology

## 2017-08-16 VITALS — BP 124/83 | HR 70 | Resp 16 | Ht 65.0 in | Wt 179.0 lb

## 2017-08-16 DIAGNOSIS — R102 Pelvic and perineal pain: Secondary | ICD-10-CM

## 2017-08-16 MED ORDER — VALACYCLOVIR HCL 1 G PO TABS: ORAL_TABLET | ORAL | 4 refills | Status: AC

## 2017-08-16 NOTE — Progress Notes (Signed)
   Subjective:    Patient ID: Shelia Gates, female    DOB: 1955-10-15, 62 y.o.   MRN: [id number]  HPI  62 yo married P1 (19 yo daughter) here today with the issue of vaginal pain and burning, a repeat of what happened when she was in [place] 5/19. She had a one particular spot on the inner right labia minora that itched. She also has burning of both labia when she wipes.  She uses a combination of coconut oil and shea butter for lubricant with sex.  Review of Systems     Objective:   Physical Exam  Breathing, conversing, and ambulating normally Well nourished, well hydrated White female, no apparent distress Normal appearing vulva, minimal atrophy Normal appearing vaginal discharge with speculum exam     Assessment & Plan:  Vulvar burning, itching - send a wet prep Trial of valtrex Come back prn

## 2017-08-17 LAB — CERVICOVAGINAL ANCILLARY ONLY
Bacterial vaginitis: NEGATIVE
CANDIDA VAGINITIS: POSITIVE — AB

## 2017-08-20 ENCOUNTER — Telehealth: Payer: Self-pay | Admitting: *Deleted

## 2017-08-20 MED ORDER — FLUCONAZOLE 150 MG PO TABS: ORAL_TABLET | ORAL | 0 refills | Status: AC

## 2017-08-20 NOTE — Telephone Encounter (Signed)
LM on voicemail that she does have yeast and [facility] VO Dr Hulan Fray.

## 2017-08-20 NOTE — Telephone Encounter (Signed)
-----   Message from Emily Filbert, MD sent at 08/20/2017  9:06 AM EDT ----- Can you please prescribe diflucan? Thanks

## 2017-10-08 ENCOUNTER — Telehealth: Payer: Self-pay | Admitting: *Deleted

## 2017-10-08 NOTE — Telephone Encounter (Signed)
Pt called stating that she was taking Estradiol 1 mg and her breast are very tender.  She is wanting to take .5 mg to see if it helps with the tenderness.  Per Dr Hulan Fray it is fine to half the med

## 2017-12-07 ENCOUNTER — Encounter: Payer: Self-pay | Admitting: Emergency Medicine

## 2017-12-07 ENCOUNTER — Emergency Department
Admission: EM | Admit: 2017-12-07 | Discharge: 2017-12-07 | Disposition: A | Discharge status: Home / Self Care | Payer: Federal, State, Local not specified - PPO | Source: Home / Self Care | Attending: Family Medicine | Admitting: Family Medicine

## 2017-12-07 ENCOUNTER — Other Ambulatory Visit: Payer: Self-pay

## 2017-12-07 DIAGNOSIS — R053 Chronic cough: Secondary | ICD-10-CM

## 2017-12-07 DIAGNOSIS — R05 Cough: Secondary | ICD-10-CM

## 2017-12-07 DIAGNOSIS — J069 Acute upper respiratory infection, unspecified: Secondary | ICD-10-CM | POA: Diagnosis not present

## 2017-12-07 MED ORDER — AZITHROMYCIN 250 MG PO TABS: 250.0000 mg | ORAL_TABLET | Freq: Every day | ORAL | 0 refills | Status: AC

## 2017-12-07 MED ORDER — BENZONATATE 100 MG PO CAPS: 100.0000 mg | ORAL_CAPSULE | Freq: Three times a day (TID) | ORAL | 0 refills | Status: AC

## 2017-12-07 NOTE — Discharge Instructions (Signed)
°  Please take antibiotics as prescribed and be sure to complete entire course even if you start to feel better to ensure infection does not come back.  Please follow up with family medicine in 1 week if not improving.

## 2017-12-07 NOTE — ED Provider Notes (Signed)
[facility]    CSN: [id number] Arrival date & time: 12/07/17  1448     History   Chief Complaint Chief Complaint  Patient presents with  . Cough    HPI Shelia Gates is a 62 y.o. female.   HPI  Shelia Gates is a 62 y.o. female presenting to UC with c/o cough that started on 11/14/17 for about 10 days, it cleared for about 2-3 days but has since returned and is more productive and forceful.  Last night it caused her to cough for 3 minutes, scaring pt it was so harsh.  She has taken mucinex and overall feels better but is concerned with the severity of cough at night.  Denies fever, chills, n/v/d. No specific known sick contacts but pt is a [employer] scientist.    Past Medical History:  Diagnosis Date  . Arthropathy   . Meningioma ([place])   . Sjogren's syndrome [facility] [place])     Patient Active Problem List   Diagnosis Date Noted  . Paresthesia 08/06/2015    Past Surgical History:  Procedure Laterality Date  . ABDOMINAL HYSTERECTOMY  2008  . CRANIOTOMY  2012    OB History    Gravida  1   Para  1   Term      Preterm      AB      Living  1     SAB      TAB      Ectopic      Multiple      Live Births               Home Medications    Prior to Admission medications   Medication Sig Start Date End Date Taking? Authorizing Provider  albuterol (PROVENTIL HFA;VENTOLIN HFA) 108 (90 Base) MCG/ACT inhaler Inhale 2 puffs into the lungs every 4 (four) hours as needed for wheezing or shortness of breath. 05/11/16   Kandra Nicolas, MD  amLODipine (NORVASC) 5 MG tablet Take 5 mg by mouth daily. 07/10/15   [provider]  aspirin 81 MG chewable tablet Chew by mouth.    [provider]  azithromycin (ZITHROMAX) 250 MG tablet Take 1 tablet (250 mg total) by mouth daily. Take first 2 tablets together, then 1 every day until finished. 12/07/17   Noe Gens, PA-C  benzonatate (TESSALON) 100 MG capsule Take 1-2 capsules (100-200 mg total) by mouth every 8  (eight) hours. 12/07/17   Noe Gens, PA-C  candesartan-hydrochlorothiazide (ATACAND HCT) 16-12.5 MG tablet Take 1 tablet by mouth daily.    [provider]  carvedilol (COREG) 12.5 MG tablet Take by mouth.    [provider]  estradiol (ESTRACE) 1 MG tablet Take 1 tablet (1 mg total) by mouth daily. Patient taking differently: Take 0.5 mg by mouth daily.  10/23/16   Emily Filbert, MD  estrogens-methylTEST (ESTRATEST) 1.25-2.5 MG tablet TAKE 1 TABLET EVERY DAY 10/31/16   Clovia Cuff C, MD  fluconazole (DIFLUCAN) 150 MG tablet Take 1 PO now and may repeat in 3 days if needed 08/20/17   Emily Filbert, MD  fluticasone (FLONASE) 50 MCG/ACT nasal spray Place 2 sprays into both nostrils daily. Reported on 08/06/2015 05/01/15   [provider]  hydroxychloroquine (PLAQUENIL) 200 MG tablet Take by mouth.    [provider]  levETIRAcetam (KEPPRA) 500 MG tablet Take by mouth.    [provider]  meloxicam (MOBIC) 15 MG tablet Take 15 mg by mouth  daily.    [provider]  mupirocin ointment (BACTROBAN) 2 % Apply to finger 3 times daily for 5 days 07/12/17   Noe Gens, PA-C  valACYclovir (VALTREX) 1000 MG tablet Take a pill daily for 7 days prn 08/16/17   Emily Filbert, MD    Family History Family History  Problem Relation Age of Onset  . Breast cancer Mother 54  . Hypertension Mother   . Atrial fibrillation Mother   . Heart disease Father   . Goiter Maternal Grandmother   . Diabetes Maternal Grandmother     Social History Social History   Tobacco Use  . Smoking status: Never Smoker  . Smokeless tobacco: Never Used  Substance Use Topics  . Alcohol use: Yes    Alcohol/week: 1.0 - 2.0 standard drinks    Types: 1 - 2 Standard drinks or equivalent per week  . Drug use: No     Allergies   Clindamycin/lincomycin; Hydrocodone; Penicillins; and Naprosyn [naproxen]   Review of Systems Review of Systems  Constitutional: Negative for chills  and fever.  HENT: Positive for congestion and ear pain (Right). Negative for sinus pressure, sinus pain and sore throat.   Respiratory: Positive for cough, chest tightness and shortness of breath. Negative for wheezing.   Cardiovascular: Negative for chest pain and palpitations.  Gastrointestinal: Negative for diarrhea, nausea and vomiting.     Physical Exam Triage Vital Signs ED Triage Vitals  Enc Vitals Group     BP 12/07/17 0847 106/70     Pulse Rate 12/07/17 0847 72     Resp 12/07/17 0847 18     Temp 12/07/17 0847 98.1 F (36.7 C)     Temp Source 12/07/17 0847 Oral     SpO2 12/07/17 0847 99 %     Weight 12/07/17 0848 180 lb (81.6 kg)     Height 12/07/17 0848 5\' 5"  (1.651 m)     Head Circumference --      Peak Flow --      Pain Score 12/07/17 0847 0     Pain Loc --      Pain Edu? --      Excl. in [place]? --    No data found.  Updated Vital Signs BP 106/70 (BP Location: Right Arm)   Pulse 72   Temp 98.1 F (36.7 C) (Oral)   Resp 18   Ht 5\' 5"  (1.651 m)   Wt 180 lb (81.6 kg)   SpO2 99%   BMI 29.95 kg/m   Visual Acuity Right Eye Distance:   Left Eye Distance:   Bilateral Distance:    Right Eye Near:   Left Eye Near:    Bilateral Near:     Physical Exam  Constitutional: She is oriented to person, place, and time. She appears well-developed and well-nourished.  HENT:  Head: Normocephalic and atraumatic.  Right Ear: Tympanic membrane normal.  Left Ear: Tympanic membrane normal.  Nose: Nose normal. Right sinus exhibits no maxillary sinus tenderness and no frontal sinus tenderness. Left sinus exhibits no maxillary sinus tenderness and no frontal sinus tenderness.  Mouth/Throat: Uvula is midline, oropharynx is clear and moist and mucous membranes are normal.  Eyes: EOM are normal.  Neck: Normal range of motion. Neck supple.  Cardiovascular: Normal rate and regular rhythm.  Pulmonary/Chest: Breath sounds normal. No stridor. No respiratory distress. She has no  wheezes. She has no rales.  Musculoskeletal: Normal range of motion.  Neurological: She is alert and oriented to  person, place, and time.  Skin: Skin is warm and dry.  Psychiatric: She has a normal mood and affect. Her behavior is normal.  Nursing note and vitals reviewed.    UC Treatments / Results  Labs (all labs ordered are listed, but only abnormal results are displayed) Labs Reviewed - No data to display  EKG None  Radiology No results found.  Procedures Procedures (including critical care time)  Medications Ordered in UC Medications - No data to display  Initial Impression / Assessment and Plan / UC Course  I have reviewed the triage vital signs and the nursing notes.  Pertinent labs & imaging results that were available during my care of the patient were reviewed by me and considered in my medical decision making (see chart for details).     Persistent, worsening cough.  Will cover for atypical bacteria with azithromycin Home care instructions provided.  Final Clinical Impressions(s) / UC Diagnoses   Final diagnoses:  Upper respiratory tract infection, unspecified type  Persistent cough     Discharge Instructions      Please take antibiotics as prescribed and be sure to complete entire course even if you start to feel better to ensure infection does not come back.  Please follow up with family medicine in 1 week if not improving.     ED Prescriptions    Medication Sig Dispense Auth. Provider   benzonatate (TESSALON) 100 MG capsule Take 1-2 capsules (100-200 mg total) by mouth every 8 (eight) hours. 21 capsule Gerarda Fraction, Valissa Lyvers O, PA-C   azithromycin (ZITHROMAX) 250 MG tablet Take 1 tablet (250 mg total) by mouth daily. Take first 2 tablets together, then 1 every day until finished. 6 tablet Noe Gens, PA-C     Controlled Substance Prescriptions [place] Controlled Substance Registry consulted? Not Applicable   Tyrell Antonio 12/07/17 0459

## 2017-12-07 NOTE — ED Triage Notes (Signed)
Patient reports cough 11/14/17 for following 10 days; it cleared; is now returned. Last night has episode of extremely harsh coughing. Has been taking Mucinex.

## 2018-01-28 ENCOUNTER — Other Ambulatory Visit: Payer: Self-pay | Admitting: Obstetrics & Gynecology

## 2018-01-28 DIAGNOSIS — Z1231 Encounter for screening mammogram for malignant neoplasm of breast: Secondary | ICD-10-CM

## 2018-01-29 ENCOUNTER — Ambulatory Visit
Admission: RE | Admit: 2018-01-29 | Discharge: 2018-01-29 | Disposition: A | Discharge status: Home / Self Care | Payer: Federal, State, Local not specified - PPO | Source: Ambulatory Visit | Attending: Obstetrics & Gynecology | Admitting: Obstetrics & Gynecology

## 2018-01-29 DIAGNOSIS — Z1231 Encounter for screening mammogram for malignant neoplasm of breast: Secondary | ICD-10-CM

## 2018-02-16 ENCOUNTER — Other Ambulatory Visit: Payer: Self-pay | Admitting: Obstetrics & Gynecology

## 2018-02-16 DIAGNOSIS — N951 Menopausal and female climacteric states: Secondary | ICD-10-CM

## 2018-08-07 ENCOUNTER — Telehealth: Payer: Self-pay | Admitting: *Deleted

## 2018-08-07 NOTE — Telephone Encounter (Signed)
Left patient a message to call and answer screening questions prior to appointment on 08/12/18 at 2:15pm.

## 2018-08-12 ENCOUNTER — Ambulatory Visit (INDEPENDENT_AMBULATORY_CARE_PROVIDER_SITE_OTHER): Payer: Federal, State, Local not specified - PPO | Admitting: Obstetrics & Gynecology

## 2018-08-12 ENCOUNTER — Encounter: Payer: Self-pay | Admitting: Obstetrics & Gynecology

## 2018-08-12 ENCOUNTER — Other Ambulatory Visit: Payer: Self-pay

## 2018-08-12 DIAGNOSIS — Z01419 Encounter for gynecological examination (general) (routine) without abnormal findings: Secondary | ICD-10-CM | POA: Diagnosis not present

## 2018-08-12 DIAGNOSIS — N951 Menopausal and female climacteric states: Secondary | ICD-10-CM

## 2018-08-12 MED ORDER — ESTRADIOL 1 MG PO TABS: 1.0000 mg | ORAL_TABLET | Freq: Every day | ORAL | 4 refills | Status: AC

## 2018-08-12 NOTE — Progress Notes (Signed)
Subjective:    Shelia Gates is a 63 y.o. married P1 who presents for an annual exam. The patient has no complaints today. The patient is sexually active. GYN screening history: last pap: was normal. The patient wears seatbelts: yes. The patient participates in regular exercise: yes. Has the patient ever been transfused or tattooed?: no. The patient reports that there is not domestic violence in her life.   Menstrual History: OB History    Gravida  1   Para  1   Term      Preterm      AB      Living  1     SAB      TAB      Ectopic      Multiple      Live Births              Menarche age: 11 No LMP recorded. Patient has had a hysterectomy.    The following portions of the patient's history were reviewed and updated as appropriate: allergies, current medications, past family history, past medical history, past social history, past surgical history and problem list.  Review of Systems Pertinent items are noted in HPI.   Mammogram normal this year.   Objective:    BP 126/85   Pulse 63   Resp 16   Ht 5\' 5"  (1.651 m)   Wt 184 lb (83.5 kg)   BMI 30.62 kg/m   General Appearance:    Alert, cooperative, no distress, appears stated age  Head:    Normocephalic, without obvious abnormality, atraumatic  Eyes:    PERRL, conjunctiva/corneas clear, EOM's intact, fundi    benign, both eyes  Ears:    Normal TM's and external ear canals, both ears  Nose:   Nares normal, septum midline, mucosa normal, no drainage    or sinus tenderness  Throat:   Lips, mucosa, and tongue normal; teeth and gums normal  Neck:   Supple, symmetrical, trachea midline, no adenopathy;    thyroid:  no enlargement/tenderness/nodules; no carotid   bruit or JVD  Back:     Symmetric, no curvature, ROM normal, no CVA tenderness  Lungs:     Clear to auscultation bilaterally, respirations unlabored  Chest Wall:    No tenderness or deformity   Heart:    Regular rate and rhythm, S1 and S2 normal, no  murmur, rub   or gallop  Breast Exam:    No tenderness, masses, or nipple abnormality  Abdomen:     Soft, non-tender, bowel sounds active all four quadrants,    no masses, no organomegaly  Genitalia:    Normal female without lesion, discharge or tenderness, no atrophy noted, speculum exam normal, bimanual reveals no masses or tenderness     Extremities:   Extremities normal, atraumatic, no cyanosis or edema  Pulses:   2+ and symmetric all extremities  Skin:   Skin color, texture, turgor normal, no rashes or lesions  Lymph nodes:   Cervical, supraclavicular, and axillary nodes normal  Neurologic:   CNII-XII intact, normal strength, sensation and reflexes    throughout  .    Assessment:    Healthy female exam.   ERT   Plan:     She can have 3 years refills of ERT without a visit.

## 2020-07-08 IMAGING — MG DIGITAL SCREENING BILATERAL MAMMOGRAM WITH TOMO AND CAD
8 series · 8 of 24 positions shown · non-contrast
Comparison: Previous exam(s).

CLINICAL DATA: Screening.

EXAM:
DIGITAL SCREENING BILATERAL MAMMOGRAM WITH TOMO AND CAD

[R MLO synth-2D]
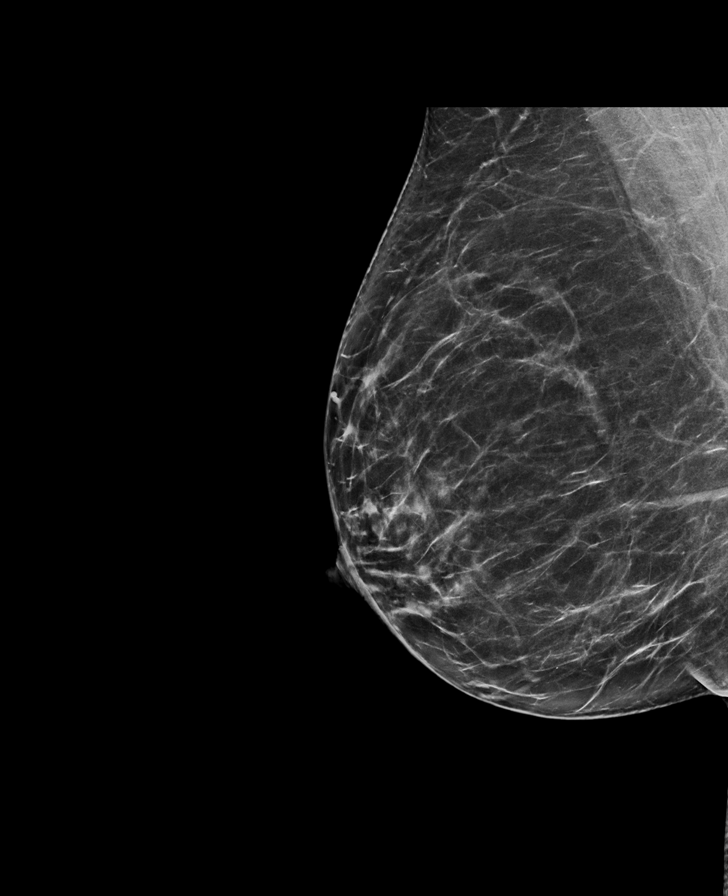

[L CC synth-2D]
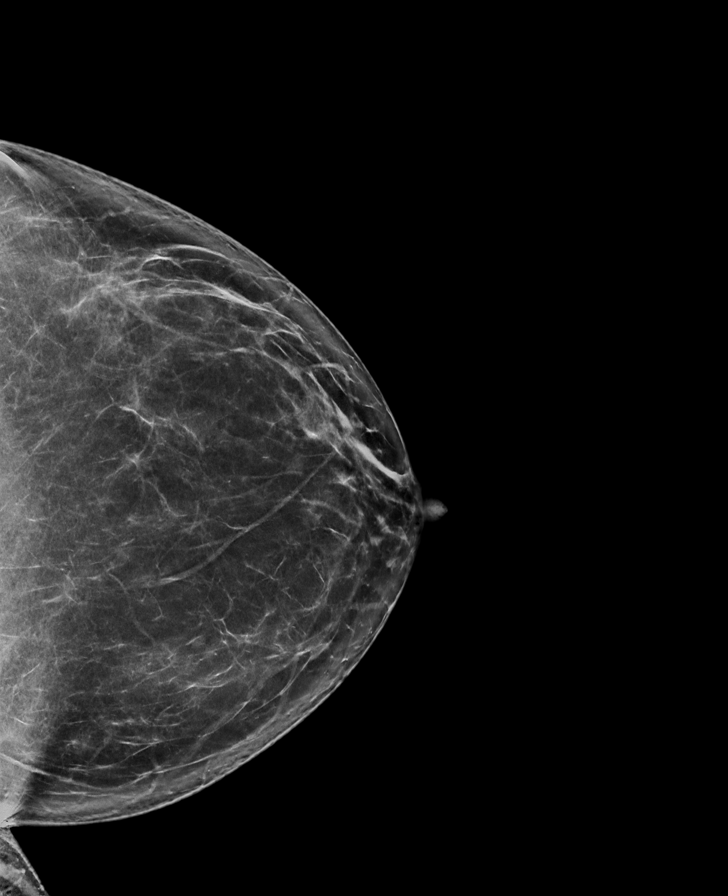

[R CC synth-2D]
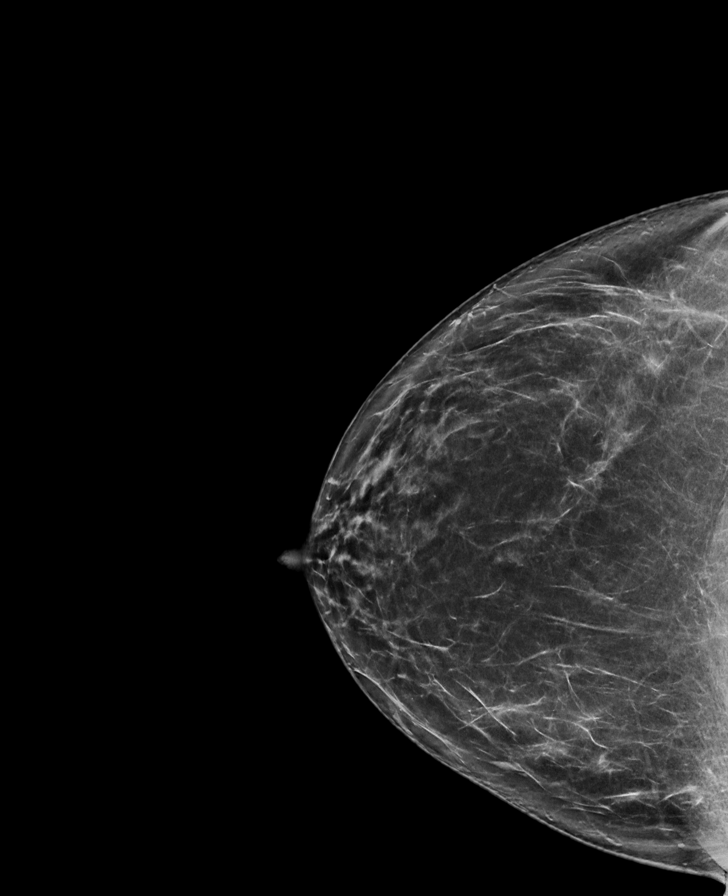

[L MLO synth-2D]
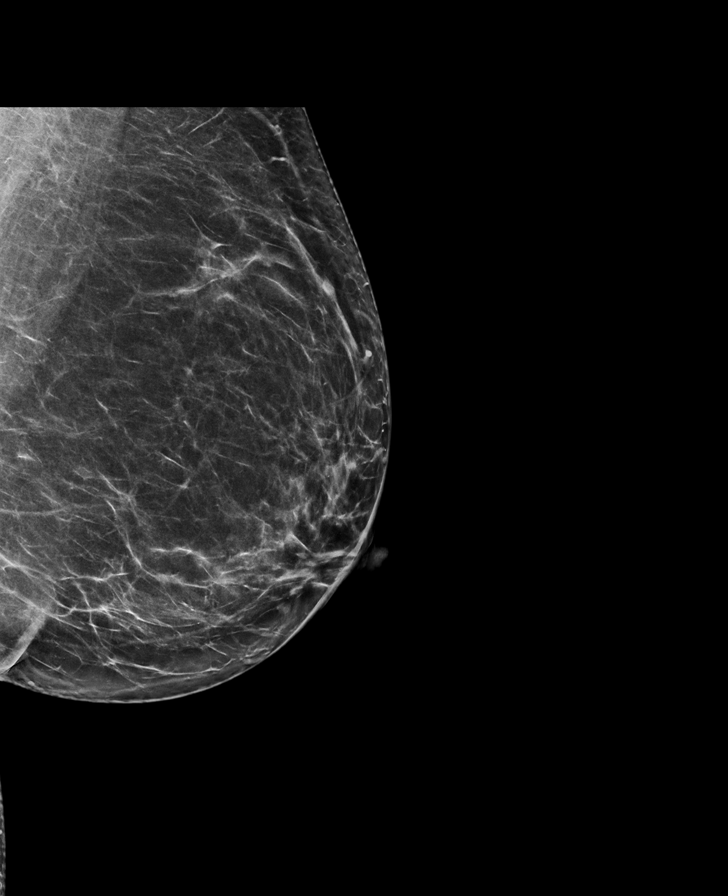

[L MLO tomo · tomo slice 41/81.0]
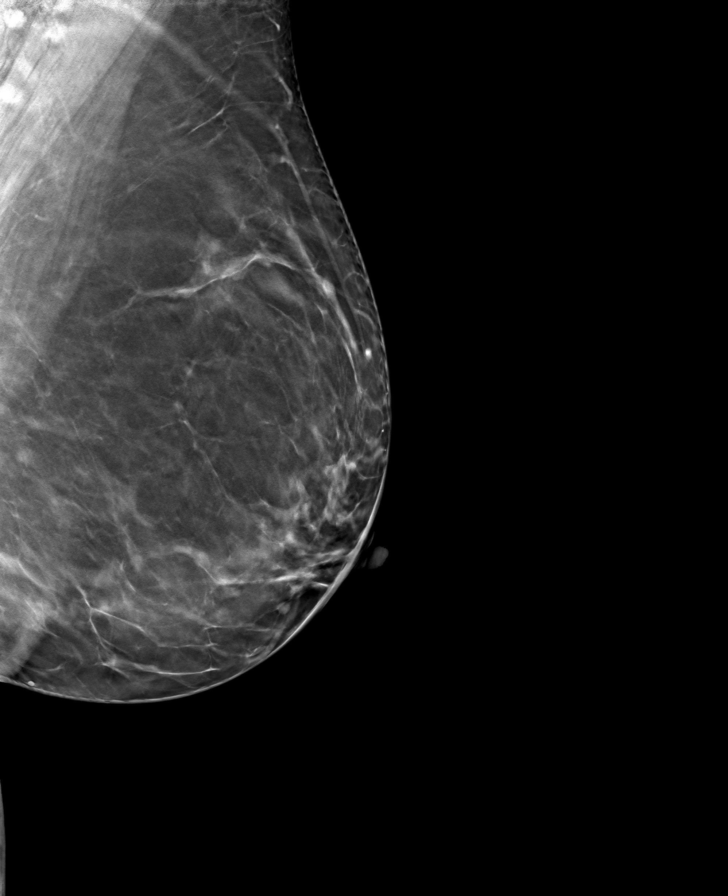

[L CC tomo · tomo slice 42/83.0]
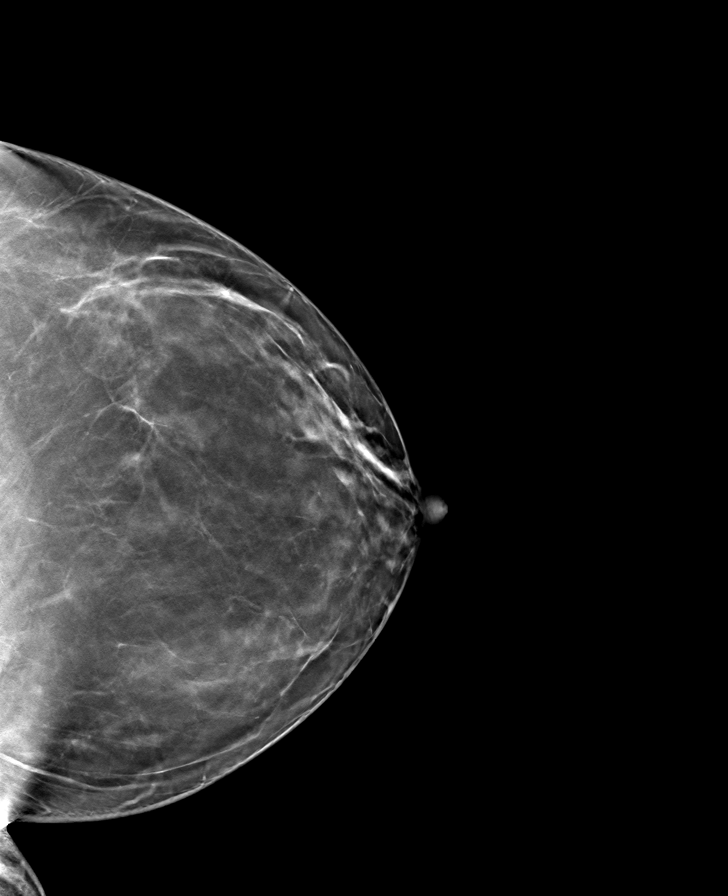

[R CC tomo · tomo slice 43/85.0]
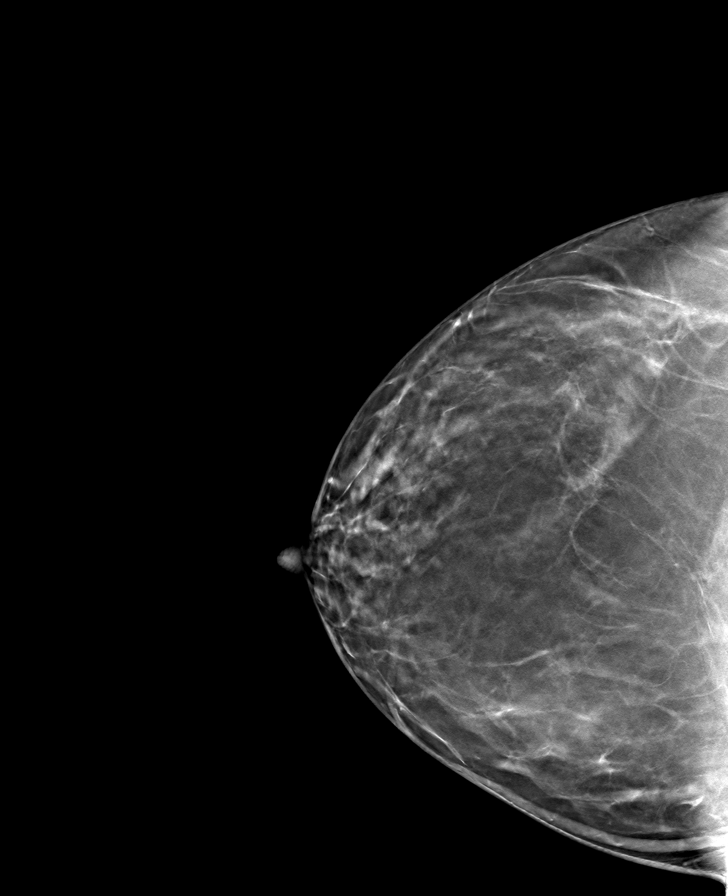

[R MLO tomo · tomo slice 41/80.0]
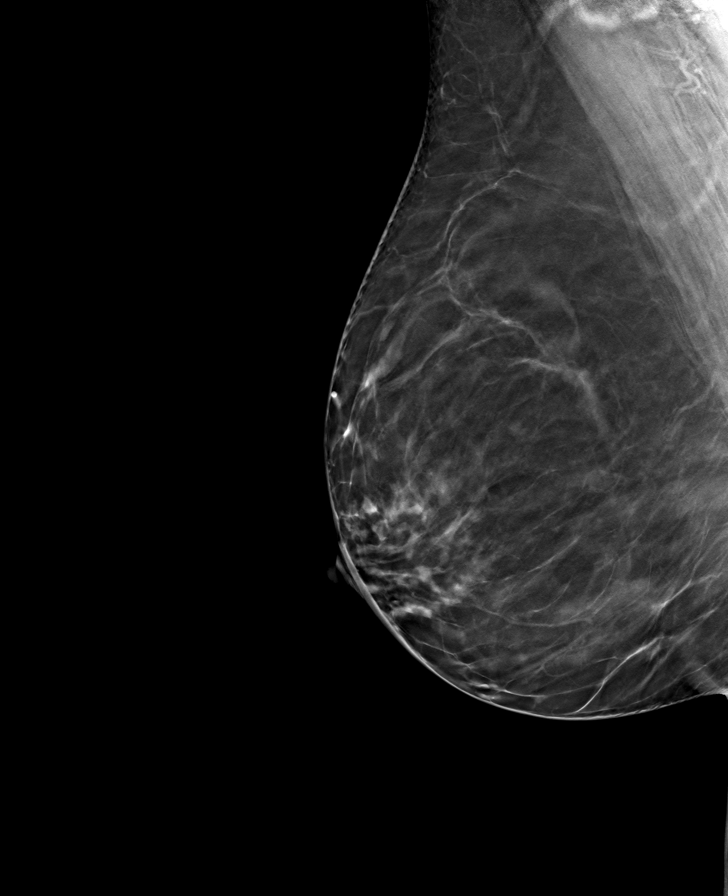

[8 of 24 positions shown; findings below may reference images not displayed]

ACR Breast Density Category b: There are scattered areas of
fibroglandular density.
FINDINGS: There are no findings suspicious for malignancy. Images were
processed with CAD.
IMPRESSION: No mammographic evidence of malignancy. A result letter of this
screening mammogram will be mailed directly to the patient.

RECOMMENDATION:
Screening mammogram in one year. (Code:[id number])

BI-RADS CATEGORY  1: Negative.
